# Patient Record
Sex: Male | Born: 1975 | Race: White | Hispanic: No | Marital: Married
Health system: Southern US, Community
[De-identification: ages and names within clinical notes are randomized; demographics above are authoritative.]

## PROBLEM LIST (undated history)

## (undated) DIAGNOSIS — F32A Depression, unspecified: Secondary | ICD-10-CM

## (undated) DIAGNOSIS — I10 Essential (primary) hypertension: Secondary | ICD-10-CM

## (undated) DIAGNOSIS — F419 Anxiety disorder, unspecified: Secondary | ICD-10-CM

## (undated) HISTORY — PX: DENTAL SURGERY: SHX609

## (undated) HISTORY — PX: FRACTURE SURGERY: SHX138

## (undated) HISTORY — PX: ELBOW SURGERY: SHX618

---

## 1999-02-27 ENCOUNTER — Emergency Department (HOSPITAL_COMMUNITY): Admission: EM | Admit: 1999-02-27 | Discharge: 1999-02-27 | Payer: Self-pay | Admitting: Emergency Medicine

## 1999-05-17 ENCOUNTER — Emergency Department (HOSPITAL_COMMUNITY): Admission: EM | Admit: 1999-05-17 | Discharge: 1999-05-17 | Payer: Self-pay | Admitting: Emergency Medicine

## 1999-08-04 ENCOUNTER — Emergency Department (HOSPITAL_COMMUNITY): Admission: EM | Admit: 1999-08-04 | Discharge: 1999-08-04 | Payer: Self-pay | Admitting: Emergency Medicine

## 1999-08-05 ENCOUNTER — Encounter: Payer: Self-pay | Admitting: Emergency Medicine

## 1999-08-12 ENCOUNTER — Emergency Department (HOSPITAL_COMMUNITY): Admission: EM | Admit: 1999-08-12 | Discharge: 1999-08-12 | Payer: Self-pay | Admitting: Emergency Medicine

## 2001-03-24 ENCOUNTER — Emergency Department (HOSPITAL_COMMUNITY): Admission: EM | Admit: 2001-03-24 | Discharge: 2001-03-24 | Payer: Self-pay | Admitting: Emergency Medicine

## 2001-03-24 ENCOUNTER — Encounter: Payer: Self-pay | Admitting: Emergency Medicine

## 2009-08-04 ENCOUNTER — Emergency Department (HOSPITAL_COMMUNITY): Admission: EM | Admit: 2009-08-04 | Discharge: 2009-08-04 | Payer: Self-pay | Admitting: Emergency Medicine

## 2011-05-25 IMAGING — CT CT HEAD W/O CM
1 series · 16 of 30 positions shown, 20 images · non-contrast
Comparison: None

CLINICAL DATA: Severe headache

CT HEAD WITHOUT CONTRAST
TECHNIQUE: Contiguous axial images were obtained from the base of
the skull through the vertex without contrast

[Series 2: brain · axial · 0.47mm/px · z∈[+165,+309]mm · 16 of 30 slices shown, 20 images]
[im 2/30  brain]
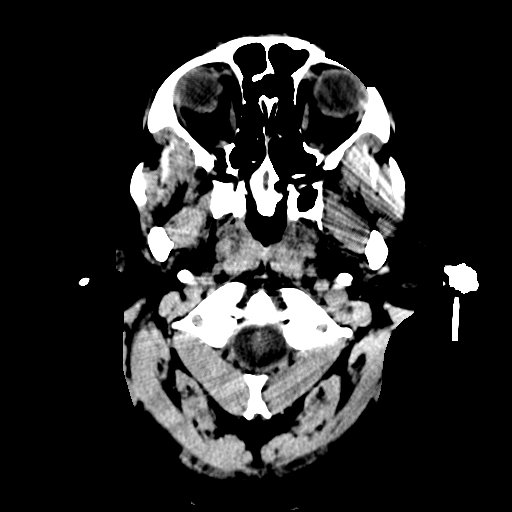
[im 2/30  bone]
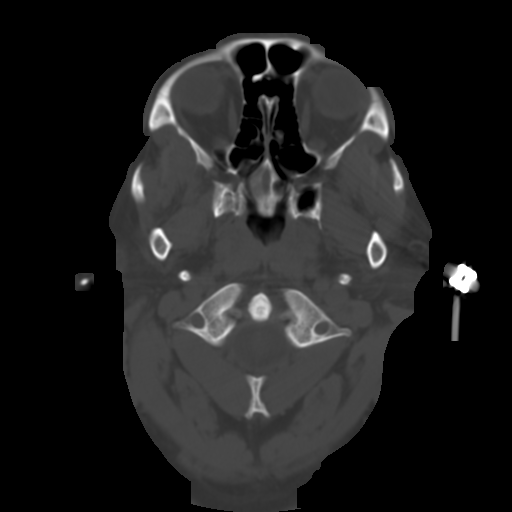
[im 4/30  brain]
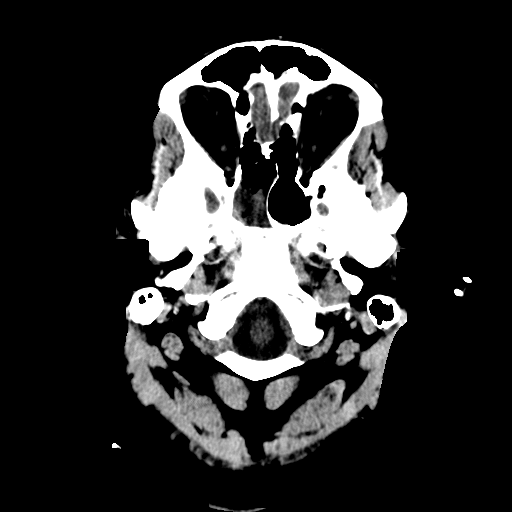
[im 6/30  brain]
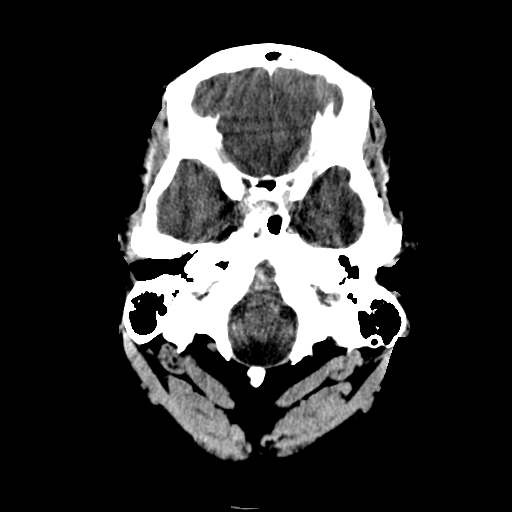
[im 8/30  brain]
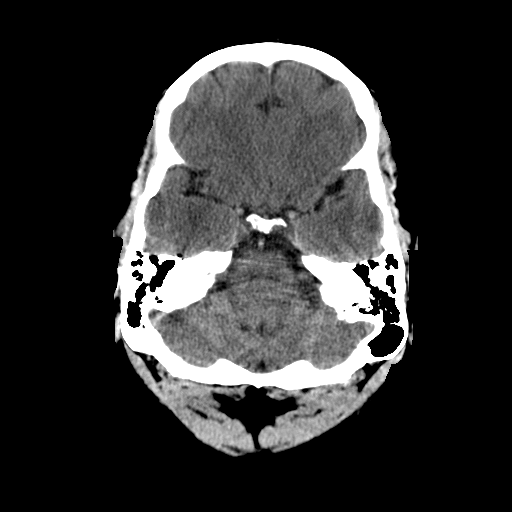
[im 9/30  brain]
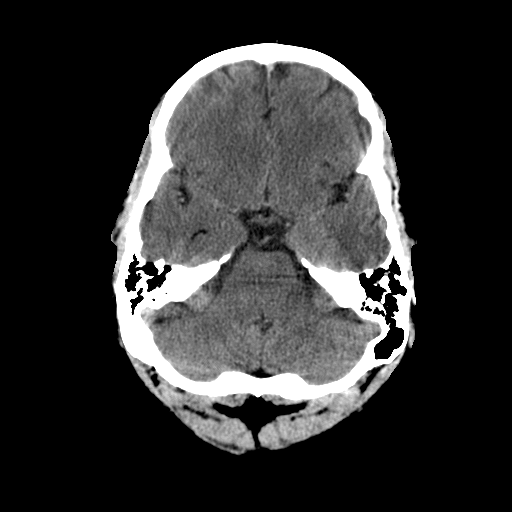
[im 9/30  bone]
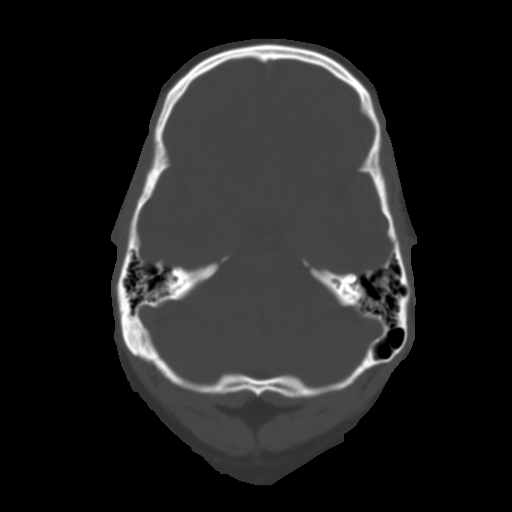
[im 11/30  brain]
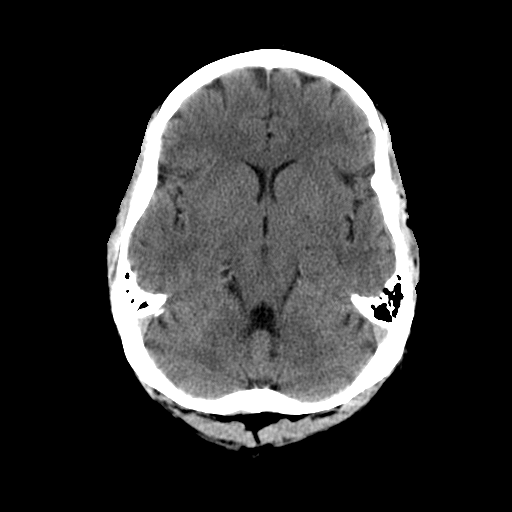
[im 13/30  brain]
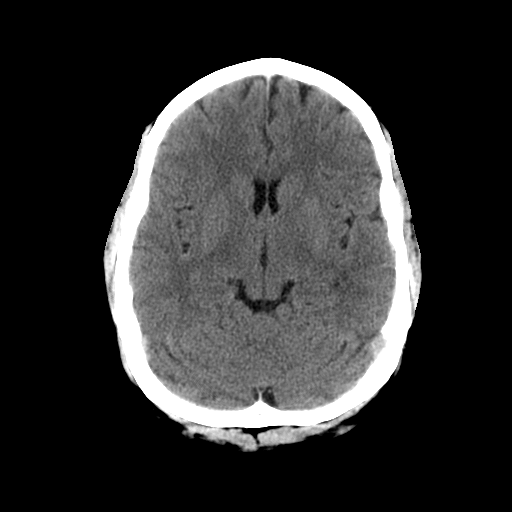
[im 15/30  brain]
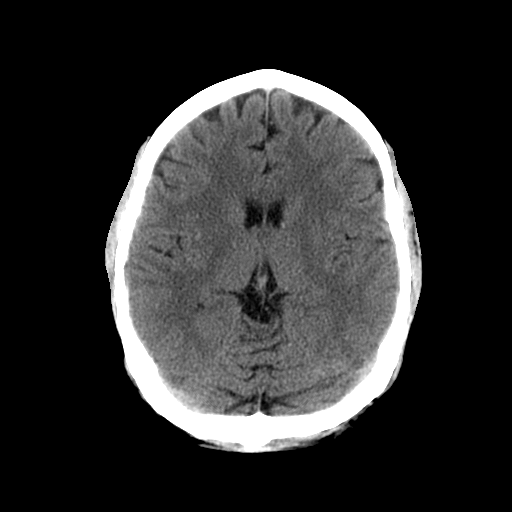
[im 16/30  brain]
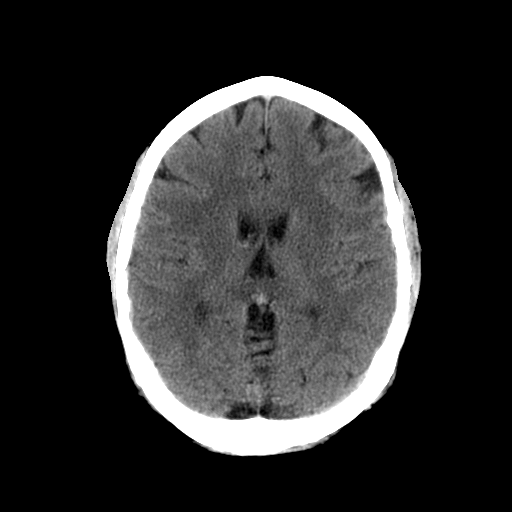
[im 16/30  bone]
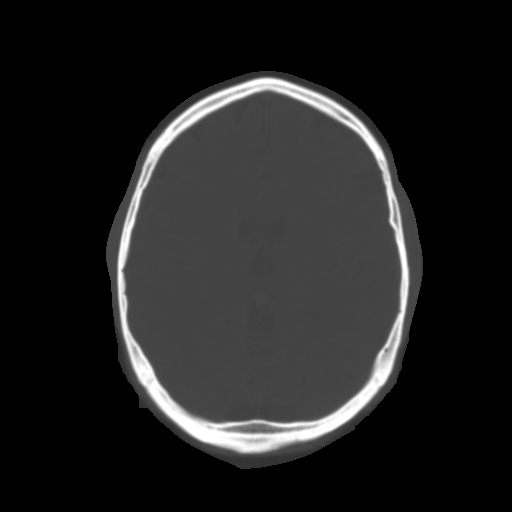
[im 18/30  brain]
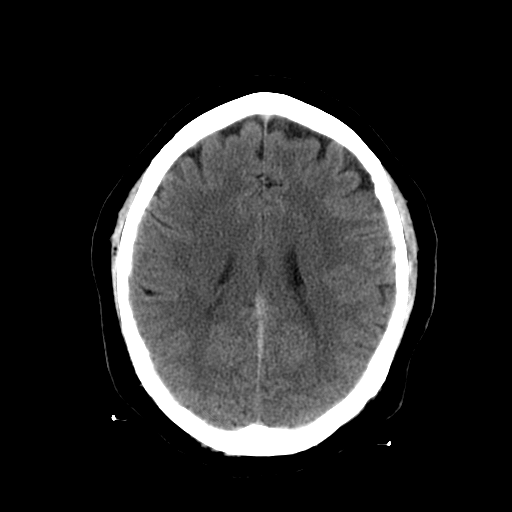
[im 20/30  brain]
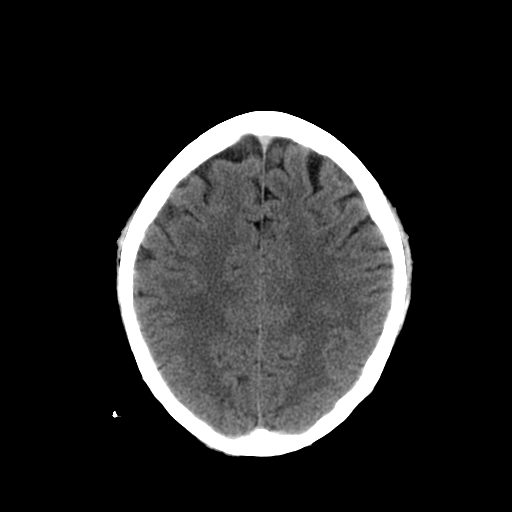
[im 22/30  brain]
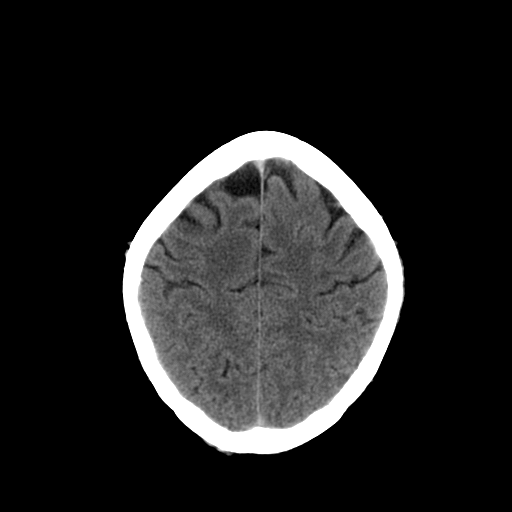
[im 23/30  brain]
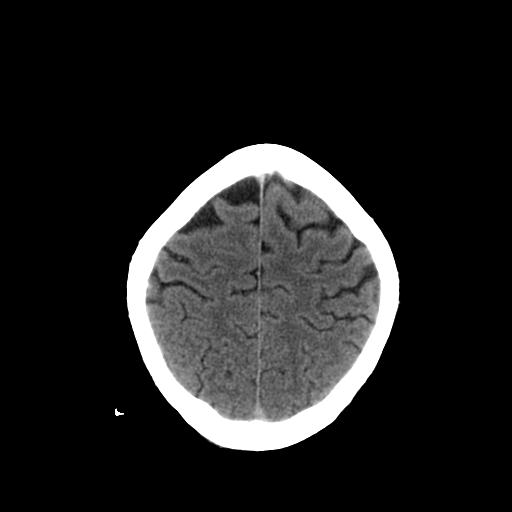
[im 23/30  bone]
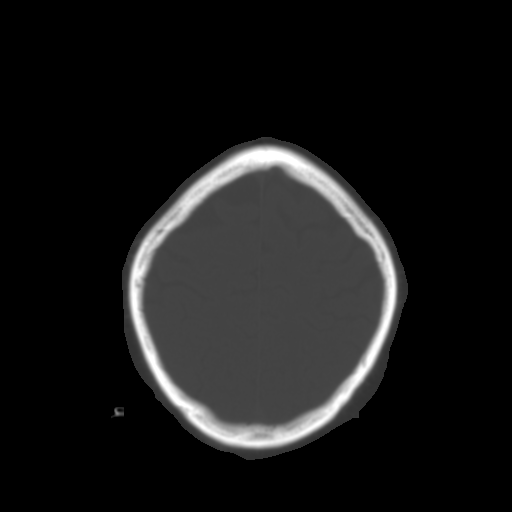
[im 25/30  brain]
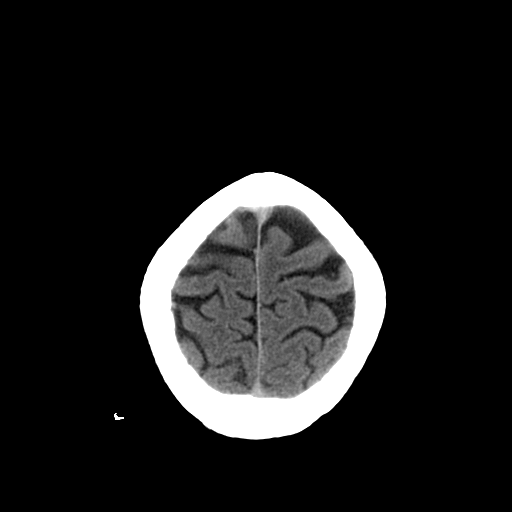
[im 27/30  brain]
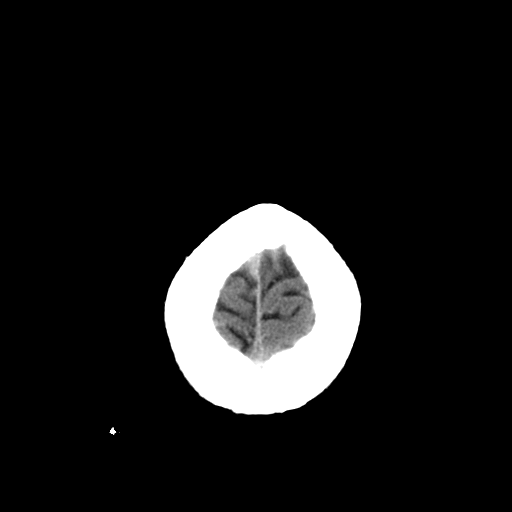
[im 29/30  brain]
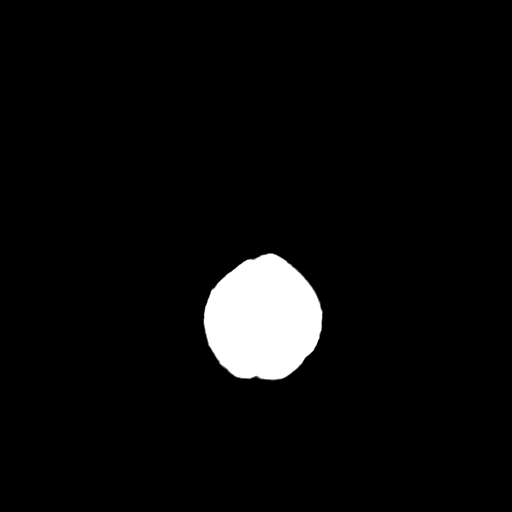

[16 of 30 positions shown; findings below may reference images not displayed]

FINDINGS: There is no evidence of intracranial hemorrhage, brain
edema, or other signs of acute infarction.  There is no evidence of
intracranial mass lesion or mass effect.  No abnormal extraaxial
fluid collections are identified.  There is no evidence of
hydrocephalus, or other significant intracranial abnormality.  No
skull abnormality identified.  Mucosal thickening is seen involving
the right sphenoid sinus, consistent with chronic sinusitis.
IMPRESSION: 1.  No intracranial abnormality identified.
2.  Chronic right sphenoid sinusitis.

## 2020-01-23 ENCOUNTER — Ambulatory Visit: Payer: Medicaid Other | Attending: Internal Medicine

## 2020-01-23 DIAGNOSIS — Z23 Encounter for immunization: Secondary | ICD-10-CM

## 2020-01-23 NOTE — Progress Notes (Signed)
   [id number]  Name:  James Bradshaw.    MRN: [id number] DOB: 02-23-1976  01/23/2020  James Bradshaw was observed post Covid-19 immunization for 15 minutes without incident. He was provided with Vaccine Information Sheet and instruction to access the V-Safe system.   James Bradshaw was instructed to call 911 with any severe reactions post vaccine: Marland Kitchen Difficulty breathing  . Swelling of face and throat  . A fast heartbeat  . A bad rash all over body  . Dizziness and weakness   Immunizations Administered    Name Date Dose VIS Date Route   Pfizer COVID-19 Vaccine 01/23/2020  1:15 PM 0.3 mL 10/17/2019 Intramuscular   Manufacturer: [employer], [employer]   Lot: [id number]   NDC: [address]-7

## 2020-02-17 ENCOUNTER — Ambulatory Visit: Payer: Medicaid Other | Attending: Internal Medicine

## 2020-02-17 DIAGNOSIS — Z23 Encounter for immunization: Secondary | ICD-10-CM

## 2020-02-17 NOTE — Progress Notes (Signed)
   [id number]  Name:  James Bradshaw.    MRN: [id number] DOB: [birth year]  02/17/2020  [name] was observed post Covid-19 immunization for 15 minutes without incident. He was provided with Vaccine Information Sheet and instruction to access the V-Safe system.   [name] was instructed to call 911 with any severe reactions post vaccine: Marland Kitchen Difficulty breathing  . Swelling of face and throat  . A fast heartbeat  . A bad rash all over body  . Dizziness and weakness   Immunizations Administered    Name Date Dose VIS Date Route   Pfizer COVID-19 Vaccine 02/17/2020  2:07 PM 0.3 mL 10/17/2019 Intramuscular   Manufacturer: [employer], [employer]   Lot: [id number]   NDC: [address]-7

## 2021-02-09 ENCOUNTER — Other Ambulatory Visit: Payer: Self-pay | Admitting: Family Medicine

## 2021-02-09 DIAGNOSIS — R1909 Other intra-abdominal and pelvic swelling, mass and lump: Secondary | ICD-10-CM

## 2021-03-02 ENCOUNTER — Ambulatory Visit: Payer: BLUE CROSS/BLUE SHIELD

## 2023-07-31 ENCOUNTER — Other Ambulatory Visit: Payer: Self-pay | Admitting: General Surgery

## 2023-07-31 DIAGNOSIS — R1909 Other intra-abdominal and pelvic swelling, mass and lump: Secondary | ICD-10-CM

## 2023-08-02 ENCOUNTER — Ambulatory Visit
Admission: RE | Admit: 2023-08-02 | Discharge: 2023-08-02 | Disposition: A | Discharge status: Home / Self Care | Payer: Medicaid Other | Source: Ambulatory Visit | Attending: General Surgery | Admitting: General Surgery

## 2023-08-02 DIAGNOSIS — R1909 Other intra-abdominal and pelvic swelling, mass and lump: Secondary | ICD-10-CM | POA: Diagnosis present

## 2023-09-13 ENCOUNTER — Ambulatory Visit: Payer: Self-pay | Admitting: General Surgery

## 2023-09-14 ENCOUNTER — Encounter
Admission: RE | Admit: 2023-09-14 | Discharge: 2023-09-14 | Disposition: A | Discharge status: Home / Self Care | Payer: Medicaid Other | Source: Ambulatory Visit | Attending: General Surgery | Admitting: General Surgery

## 2023-09-14 ENCOUNTER — Other Ambulatory Visit: Payer: Self-pay

## 2023-09-14 VITALS — Ht 75.0 in | Wt 353.0 lb

## 2023-09-14 DIAGNOSIS — I10 Essential (primary) hypertension: Secondary | ICD-10-CM

## 2023-09-14 HISTORY — DX: Anxiety disorder, unspecified: F41.9

## 2023-09-14 HISTORY — DX: Depression, unspecified: F32.A

## 2023-09-14 HISTORY — DX: Essential (primary) hypertension: I10

## 2023-09-14 NOTE — Patient Instructions (Addendum)
Your procedure is scheduled on: Friday 09/21/23  To find out your arrival time, please call [phone] between 1PM - 3PM on:  Thursday 09/20/23  Report to the Registration Desk on the 1st floor of the [facility]. Free Valet parking is available.  If your arrival time is 6:00 am, do not arrive before that time as the [facility] entrance doors do not open until 6:00 am.  REMEMBER: Instructions that are not followed completely may result in serious medical risk, up to and including death; or upon the discretion of your surgeon and anesthesiologist your surgery may need to be rescheduled.  Do not eat food or drink any liquids after midnight the night before surgery.  No gum chewing or hard candies.  One week prior to surgery: Stop Anti-inflammatories (NSAIDS) such as Advil, Aleve, Ibuprofen, Motrin, Naproxen, Naprosyn and Aspirin based products such as Excedrin, Goody's Powder, BC Powder. You may however, continue to take Tylenol if needed for pain up until the day of surgery.  Stop ANY OVER THE COUNTER supplements or vitamins until after surgery.  Continue taking all prescribed medications.   TAKE ONLY THESE MEDICATIONS THE MORNING OF SURGERY WITH A SIP OF WATER:  escitalopram (LEXAPRO) 20 MG tablet   Antacid (take one the night before and one on the morning of surgery - helps to prevent nausea after surgery.)  No Alcohol for 24 hours before or after surgery.  No Smoking including e-cigarettes for 24 hours before surgery.  No chewable tobacco products for at least 6 hours before surgery.  No nicotine patches on the day of surgery.  Do not use any "recreational" drugs for at least a week (preferably 2 weeks) before your surgery.  Please be advised that the combination of cocaine and anesthesia may have negative outcomes, up to and including death. If you test positive for cocaine, your surgery will be cancelled.  On the morning of surgery brush your teeth with toothpaste and  water, you may rinse your mouth with mouthwash if you wish. Do not swallow any toothpaste or mouthwash.  Use CHG Soap or wipes as directed on instruction sheet.  Do not wear lotions, powders, or perfumes/cologne or deodorant.   Do not shave body hair from the neck down 48 hours before surgery.  Wear comfortable clothing (specific to your surgery type) to the hospital.  Do not wear jewelry, make-up, hairpins, clips or nail polish.  For welded (permanent) jewelry: bracelets, anklets, waist bands, etc.  Please have this removed prior to surgery.  If it is not removed, there is a chance that hospital personnel will need to cut it off on the day of surgery. Contact lenses, hearing aids and dentures may not be worn into surgery.  Do not bring valuables to the hospital. [facility] is not responsible for any missing/lost belongings or valuables.   Notify your doctor if there is any change in your medical condition (cold, fever, infection).  If you are being discharged the day of surgery, you will not be allowed to drive home. You will need a responsible individual to drive you home and stay with you for 24 hours after surgery.   If you are taking public transportation, you will need to have a responsible individual with you.  If you are being admitted to the hospital overnight, leave your suitcase in the car. After surgery it may be brought to your room.  In case of increased patient census, it may be necessary for you, the patient, to  continue your postoperative care in the Same Day Surgery department.  After surgery, you can help prevent lung complications by doing breathing exercises.  Take deep breaths and cough every 1-2 hours. Your doctor may order a device called an Incentive Spirometer to help you take deep breaths. When coughing or sneezing, hold a pillow firmly against your incision with both hands. This is called "splinting." Doing this helps protect your incision. It also  decreases belly discomfort.  Surgery Visitation Policy:  Patients undergoing a surgery or procedure may have two family members or support persons with them as long as the person is not COVID-19 positive or experiencing its symptoms.   Inpatient Visitation:    Visiting hours are 7 a.m. to 8 p.m. Up to four visitors are allowed at one time in a patient room. The visitors may rotate out with other people during the day. One designated support person (adult) may remain overnight.  Please call the Pre-admissions Testing Dept. at [phone] if you have any questions about these instructions.     Preparing for Surgery with CHLORHEXIDINE GLUCONATE (CHG) Soap  Chlorhexidine Gluconate (CHG) Soap  o An antiseptic cleaner that kills germs and bonds with the skin to continue killing germs even after washing  o Used for showering the night before surgery and morning of surgery  Before surgery, you can play an important role by reducing the number of germs on your skin.  CHG (Chlorhexidine gluconate) soap is an antiseptic cleanser which kills germs and bonds with the skin to continue killing germs even after washing.  Please do not use if you have an allergy to CHG or antibacterial soaps. If your skin becomes reddened/irritated stop using the CHG.  1. Shower the NIGHT BEFORE SURGERY and the MORNING OF SURGERY with CHG soap.  2. If you choose to wash your hair, wash your hair first as usual with your normal shampoo.  3. After shampooing, rinse your hair and body thoroughly to remove the shampoo.  4. Use CHG as you would any other liquid soap. You can apply CHG directly to the skin and wash gently with a scrungie or a clean washcloth.  5. Apply the CHG soap to your body only from the neck down. Do not use on open wounds or open sores. Avoid contact with your eyes, ears, mouth, and genitals (private parts). Wash face and genitals (private parts) with your normal soap.  6. Wash thoroughly,  paying special attention to the area where your surgery will be performed.  7. Thoroughly rinse your body with warm water.  8. Do not shower/wash with your normal soap after using and rinsing off the CHG soap.  9. Pat yourself dry with a clean towel.  10. Wear clean pajamas to bed the night before surgery.  12. Place clean sheets on your bed the night of your first shower and do not sleep with pets.  13. Shower again with the CHG soap on the day of surgery prior to arriving at the hospital.  14. Do not apply any deodorants/lotions/powders.  15. Please wear clean clothes to the hospital.

## 2023-09-17 ENCOUNTER — Encounter
Admission: RE | Admit: 2023-09-17 | Discharge: 2023-09-17 | Disposition: A | Discharge status: Home / Self Care | Payer: Medicaid Other | Source: Ambulatory Visit | Attending: General Surgery | Admitting: General Surgery

## 2023-09-17 DIAGNOSIS — Z01810 Encounter for preprocedural cardiovascular examination: Secondary | ICD-10-CM | POA: Diagnosis present

## 2023-09-17 DIAGNOSIS — I10 Essential (primary) hypertension: Secondary | ICD-10-CM | POA: Insufficient documentation

## 2023-09-21 ENCOUNTER — Ambulatory Visit
Admission: RE | Admit: 2023-09-21 | Discharge: 2023-09-21 | Disposition: A | Discharge status: Home / Self Care | Payer: Medicaid Other | Attending: General Surgery | Admitting: General Surgery

## 2023-09-21 ENCOUNTER — Ambulatory Visit: Payer: Medicaid Other | Admitting: Anesthesiology

## 2023-09-21 ENCOUNTER — Other Ambulatory Visit: Payer: Self-pay

## 2023-09-21 ENCOUNTER — Encounter: Payer: Self-pay | Admitting: General Surgery

## 2023-09-21 ENCOUNTER — Encounter
Admission: RE | Disposition: A | Discharge status: Home / Self Care | Payer: Self-pay | Source: Home / Self Care | Attending: General Surgery

## 2023-09-21 DIAGNOSIS — R1909 Other intra-abdominal and pelvic swelling, mass and lump: Secondary | ICD-10-CM | POA: Diagnosis present

## 2023-09-21 DIAGNOSIS — D171 Benign lipomatous neoplasm of skin and subcutaneous tissue of trunk: Secondary | ICD-10-CM | POA: Insufficient documentation

## 2023-09-21 HISTORY — PX: LIPOMA EXCISION: SHX5283

## 2023-09-21 SURGERY — EXCISION LIPOMA
Anesthesia: General | Site: Groin | Laterality: Right | Wound class: Clean

## 2023-09-21 MED ORDER — ONDANSETRON HCL 4 MG/2ML IJ SOLN
INTRAMUSCULAR | Status: DC | PRN
  Administered 2023-09-21 (×2): 4 mg via INTRAVENOUS

## 2023-09-21 MED ORDER — MIDAZOLAM HCL 2 MG/2ML IJ SOLN
INTRAMUSCULAR | Status: DC | PRN
  Administered 2023-09-21: 2 mg via INTRAVENOUS

## 2023-09-21 MED ORDER — ROCURONIUM BROMIDE 100 MG/10ML IV SOLN
INTRAVENOUS | Status: DC | PRN
  Administered 2023-09-21: 50 mg via INTRAVENOUS

## 2023-09-21 MED ORDER — BUPIVACAINE-EPINEPHRINE (PF) 0.5% -1:200000 IJ SOLN
INTRAMUSCULAR | Status: AC
  Filled 2023-09-21: qty 30

## 2023-09-21 MED ORDER — OXYCODONE HCL 5 MG/5ML PO SOLN
5.0000 mg | Freq: Once | ORAL | Status: DC | PRN
Start: 2023-09-21 — End: 2023-09-21

## 2023-09-21 MED ORDER — LACTATED RINGERS IV SOLN: INTRAVENOUS | Status: DC

## 2023-09-21 MED ORDER — PROPOFOL 1000 MG/100ML IV EMUL
INTRAVENOUS | Status: AC
Start: 2023-09-21 — End: ?
  Filled 2023-09-21: qty 100

## 2023-09-21 MED ORDER — PROPOFOL 10 MG/ML IV BOLUS
INTRAVENOUS | Status: DC | PRN
  Administered 2023-09-21: 30 mg via INTRAVENOUS
  Administered 2023-09-21: 200 mg via INTRAVENOUS

## 2023-09-21 MED ORDER — CEFAZOLIN SODIUM-DEXTROSE 2-4 GM/100ML-% IV SOLN
2.0000 g | INTRAVENOUS | Status: AC
Start: 2023-09-21 — End: 2023-09-21
  Administered 2023-09-21: 1 g via INTRAVENOUS
  Administered 2023-09-21: 2 g via INTRAVENOUS

## 2023-09-21 MED ORDER — CHLORHEXIDINE GLUCONATE 0.12 % MT SOLN
OROMUCOSAL | Status: AC
Start: 2023-09-21 — End: ?
  Filled 2023-09-21: qty 15

## 2023-09-21 MED ORDER — LIDOCAINE HCL (CARDIAC) PF 100 MG/5ML IV SOSY
PREFILLED_SYRINGE | INTRAVENOUS | Status: DC | PRN
  Administered 2023-09-21: 100 mg via INTRAVENOUS

## 2023-09-21 MED ORDER — FENTANYL CITRATE (PF) 100 MCG/2ML IJ SOLN: 25.0000 ug | INTRAMUSCULAR | Status: DC | PRN

## 2023-09-21 MED ORDER — ACETAMINOPHEN 10 MG/ML IV SOLN
INTRAVENOUS | Status: DC | PRN
  Administered 2023-09-21: 1000 mg via INTRAVENOUS

## 2023-09-21 MED ORDER — ACETAMINOPHEN 10 MG/ML IV SOLN
INTRAVENOUS | Status: AC
Start: 2023-09-21 — End: ?
  Filled 2023-09-21: qty 100

## 2023-09-21 MED ORDER — BUPIVACAINE-EPINEPHRINE (PF) 0.25% -1:200000 IJ SOLN
INTRAMUSCULAR | Status: AC
Start: 2023-09-21 — End: ?
  Filled 2023-09-21: qty 30

## 2023-09-21 MED ORDER — SUCCINYLCHOLINE CHLORIDE 200 MG/10ML IV SOSY
PREFILLED_SYRINGE | INTRAVENOUS | Status: DC | PRN
  Administered 2023-09-21: 140 mg via INTRAVENOUS

## 2023-09-21 MED ORDER — PHENYLEPHRINE HCL-NACL 20-0.9 MG/250ML-% IV SOLN
INTRAVENOUS | Status: AC
Start: 2023-09-21 — End: ?
  Filled 2023-09-21: qty 250

## 2023-09-21 MED ORDER — CEFAZOLIN SODIUM-DEXTROSE 1-4 GM/50ML-% IV SOLN
INTRAVENOUS | Status: AC
  Filled 2023-09-21: qty 50

## 2023-09-21 MED ORDER — BUPIVACAINE-EPINEPHRINE 0.5% -1:200000 IJ SOLN
INTRAMUSCULAR | Status: DC | PRN
  Administered 2023-09-21: 30 mL

## 2023-09-21 MED ORDER — 0.9 % SODIUM CHLORIDE (POUR BTL) OPTIME
TOPICAL | Status: DC | PRN
  Administered 2023-09-21: 500 mL

## 2023-09-21 MED ORDER — ORAL CARE MOUTH RINSE: 15.0000 mL | Freq: Once | OROMUCOSAL | Status: AC

## 2023-09-21 MED ORDER — DEXAMETHASONE SODIUM PHOSPHATE 10 MG/ML IJ SOLN
INTRAMUSCULAR | Status: DC | PRN
  Administered 2023-09-21: 10 mg via INTRAVENOUS

## 2023-09-21 MED ORDER — CHLORHEXIDINE GLUCONATE 0.12 % MT SOLN
15.0000 mL | Freq: Once | OROMUCOSAL | Status: AC
Start: 2023-09-21 — End: 2023-09-21
  Administered 2023-09-21: 15 mL via OROMUCOSAL

## 2023-09-21 MED ORDER — DEXMEDETOMIDINE HCL IN NACL 200 MCG/50ML IV SOLN
INTRAVENOUS | Status: DC | PRN
  Administered 2023-09-21: 8 ug via INTRAVENOUS
  Administered 2023-09-21: 12 ug via INTRAVENOUS

## 2023-09-21 MED ORDER — CEFAZOLIN SODIUM-DEXTROSE 2-4 GM/100ML-% IV SOLN
INTRAVENOUS | Status: AC
  Filled 2023-09-21: qty 100

## 2023-09-21 MED ORDER — OXYCODONE HCL 5 MG PO TABS: 5.0000 mg | ORAL_TABLET | Freq: Once | ORAL | Status: DC | PRN

## 2023-09-21 MED ORDER — GLYCOPYRROLATE 0.2 MG/ML IJ SOLN
INTRAMUSCULAR | Status: DC | PRN
  Administered 2023-09-21: .2 mg via INTRAVENOUS

## 2023-09-21 MED ORDER — PROPOFOL 1000 MG/100ML IV EMUL
INTRAVENOUS | Status: AC
  Filled 2023-09-21: qty 100

## 2023-09-21 MED ORDER — MIDAZOLAM HCL 2 MG/2ML IJ SOLN
INTRAMUSCULAR | Status: AC
  Filled 2023-09-21: qty 2

## 2023-09-21 MED ORDER — PHENYLEPHRINE 80 MCG/ML (10ML) SYRINGE FOR IV PUSH (FOR BLOOD PRESSURE SUPPORT)
PREFILLED_SYRINGE | INTRAVENOUS | Status: DC | PRN
  Administered 2023-09-21: 80 ug via INTRAVENOUS
  Administered 2023-09-21 (×7): 160 ug via INTRAVENOUS

## 2023-09-21 MED ORDER — EPHEDRINE SULFATE-NACL 50-0.9 MG/10ML-% IV SOSY
PREFILLED_SYRINGE | INTRAVENOUS | Status: DC | PRN
  Administered 2023-09-21: 5 mg via INTRAVENOUS

## 2023-09-21 MED ORDER — FENTANYL CITRATE (PF) 100 MCG/2ML IJ SOLN
INTRAMUSCULAR | Status: AC
  Filled 2023-09-21: qty 2

## 2023-09-21 MED ORDER — FENTANYL CITRATE (PF) 100 MCG/2ML IJ SOLN
INTRAMUSCULAR | Status: DC | PRN
  Administered 2023-09-21 (×2): 50 ug via INTRAVENOUS

## 2023-09-21 MED ORDER — SUGAMMADEX SODIUM 200 MG/2ML IV SOLN
INTRAVENOUS | Status: DC | PRN
  Administered 2023-09-21: 300 mg via INTRAVENOUS

## 2023-09-21 SURGICAL SUPPLY — 28 items
CHLORAPREP W/TINT 26 (MISCELLANEOUS) ×2 IMPLANT
DERMABOND ADVANCED .7 DNX12 (GAUZE/BANDAGES/DRESSINGS) ×2 IMPLANT
DRAPE LAPAROTOMY 100X77 ABD (DRAPES) ×2 IMPLANT
ELECT CAUTERY BLADE 6.4 (BLADE) ×2 IMPLANT
ELECT REM PT RETURN 9FT ADLT (ELECTROSURGICAL) ×1
ELECTRODE REM PT RTRN 9FT ADLT (ELECTROSURGICAL) ×2 IMPLANT
GLOVE BIO SURGEON STRL SZ 6.5 (GLOVE) ×2 IMPLANT
GLOVE BIOGEL PI IND STRL 6.5 (GLOVE) ×2 IMPLANT
GOWN STRL REUS W/ TWL LRG LVL3 (GOWN DISPOSABLE) ×4 IMPLANT
GOWN STRL REUS W/TWL LRG LVL3 (GOWN DISPOSABLE) ×2
KIT TURNOVER KIT A (KITS) ×2 IMPLANT
LABEL OR SOLS (LABEL) ×2 IMPLANT
MANIFOLD NEPTUNE II (INSTRUMENTS) ×2 IMPLANT
NDL HYPO 25X1 1.5 SAFETY (NEEDLE) ×2 IMPLANT
NEEDLE HYPO 25X1 1.5 SAFETY (NEEDLE) ×1 IMPLANT
NS IRRIG 500ML POUR BTL (IV SOLUTION) ×2 IMPLANT
PACK BASIN MINOR ARMC (MISCELLANEOUS) ×2 IMPLANT
SUT ETHILON 3-0 (SUTURE) IMPLANT
SUT MNCRL 4-0 (SUTURE) ×1
SUT MNCRL 4-0 27XMFL (SUTURE) ×1
SUT VIC AB 2-0 SH 27 (SUTURE) ×1
SUT VIC AB 2-0 SH 27XBRD (SUTURE) IMPLANT
SUT VIC AB 3-0 SH 27 (SUTURE) ×1
SUT VIC AB 3-0 SH 27X BRD (SUTURE) ×2 IMPLANT
SUTURE MNCRL 4-0 27XMF (SUTURE) ×2 IMPLANT
SYR 10ML LL (SYRINGE) ×2 IMPLANT
TRAP FLUID SMOKE EVACUATOR (MISCELLANEOUS) ×2 IMPLANT
WATER STERILE IRR 500ML POUR (IV SOLUTION) ×2 IMPLANT

## 2023-09-21 NOTE — Anesthesia Postprocedure Evaluation (Signed)
Anesthesia Post Note  Patient: James Bradshaw  Procedure(s) Performed: EXCISION LIPOMA (Right: Groin)  Patient location during evaluation: PACU Anesthesia Type: General Level of consciousness: awake and alert Pain management: pain level controlled Vital Signs Assessment: post-procedure vital signs reviewed and stable Respiratory status: spontaneous breathing, nonlabored ventilation, respiratory function stable and patient connected to nasal cannula oxygen Cardiovascular status: blood pressure returned to baseline and stable Postop Assessment: no apparent nausea or vomiting Anesthetic complications: yes  Encounter Notable Events  Notable Event Outcome Phase Comment  Difficult to intubate - expected  Intraprocedure Filed from anesthesia note documentation.     Last Vitals:  Vitals:   09/21/23 0846 09/21/23 0857  BP: (!) 79/56 (!) 84/58  Pulse: 68 67  Resp: (!) 25 18  Temp:    SpO2: 92% 91%    Last Pain:  Vitals:   09/21/23 0828  TempSrc:   PainSc: Asleep                 Stephanie Coup

## 2023-09-21 NOTE — Anesthesia Preprocedure Evaluation (Signed)
Anesthesia Evaluation  Patient identified by MRN, date of birth, ID band Patient awake    Reviewed: Allergy & Precautions, NPO status , Patient's Chart, lab work & pertinent test results  Airway Mallampati: III  TM Distance: >3 FB Neck ROM: full    Dental  (+) Chipped, Dental Advidsory Given   Pulmonary neg pulmonary ROS, former smoker   Pulmonary exam normal        Cardiovascular hypertension, negative cardio ROS Normal cardiovascular exam     Neuro/Psych  PSYCHIATRIC DISORDERS Anxiety     negative neurological ROS     GI/Hepatic negative GI ROS, Neg liver ROS,,,  Endo/Other    Class 3 obesity  Renal/GU      Musculoskeletal   Abdominal   Peds  Hematology negative hematology ROS (+)   Anesthesia Other Findings Past Medical History: No date: Anxiety No date: Depression No date: Hypertension  Past Surgical History: No date: DENTAL SURGERY     Comment:  several No date: ELBOW SURGERY; Left     Comment:  pinning No date: FRACTURE SURGERY; Right     Comment:  lower extremity  BMI    Body Mass Index: 42.93 kg/m      Reproductive/Obstetrics negative OB ROS                             Anesthesia Physical Anesthesia Plan  ASA: 3  Anesthesia Plan: General   Post-op Pain Management:    Induction: Intravenous  PONV Risk Score and Plan: 2 and Ondansetron, Dexamethasone and Midazolam  Airway Management Planned: LMA  Additional Equipment:   Intra-op Plan:   Post-operative Plan: Extubation in OR  Informed Consent: I have reviewed the patients History and Physical, chart, labs and discussed the procedure including the risks, benefits and alternatives for the proposed anesthesia with the patient or authorized representative who has indicated his/her understanding and acceptance.     Dental Advisory Given  Plan Discussed with: Anesthesiologist, CRNA and Surgeon  Anesthesia  Plan Comments: (Patient consented for risks of anesthesia including but not limited to:  - adverse reactions to medications - damage to eyes, teeth, lips or other oral mucosa - nerve damage due to positioning  - sore throat or hoarseness - Damage to heart, brain, nerves, lungs, other parts of body or loss of life  Patient voiced understanding and assent.)       Anesthesia Quick Evaluation

## 2023-09-21 NOTE — Discharge Instructions (Signed)
  Diet: Resume home heart healthy regular diet.   Activity: No specific activity restrictions.   Wound care: May shower with soapy water and pat dry (do not rub incisions), but no baths or submerging incision underwater until follow-up. (no swimming)   Medications: Resume all home medications. For mild to moderate pain: acetaminophen (Tylenol) or ibuprofen (if no kidney disease). Combining Tylenol with alcohol can substantially increase your risk of causing liver disease.   Call office [phone]) at any time if any questions, worsening pain, fevers/chills, bleeding, drainage from incision site, or other concerns.

## 2023-09-21 NOTE — Anesthesia Procedure Notes (Signed)
Procedure Name: Intubation Date/Time: 09/21/2023 7:40 AM  Performed by: Mohammed Kindle, CRNAPre-anesthesia Checklist: Patient identified, Emergency Drugs available, Suction available and Patient being monitored Patient Re-evaluated:Patient Re-evaluated prior to induction Oxygen Delivery Method: Circle system utilized Preoxygenation: Pre-oxygenation with 100% oxygen Induction Type: IV induction Ventilation: Two handed mask ventilation required Laryngoscope Size: McGrath and 4 Grade View: Grade I Tube type: Oral Number of attempts: 1 Airway Equipment and Method: Stylet Placement Confirmation: ETT inserted through vocal cords under direct vision, positive ETCO2, breath sounds checked- equal and bilateral and CO2 detector Secured at: 22 cm Tube secured with: Tape Dental Injury: Teeth and Oropharynx as per pre-operative assessment  Difficulty Due To: Difficulty was anticipated, Difficult Airway- due to dentition and Difficult Airway- due to limited oral opening Future Recommendations: Recommend- induction with short-acting agent, and alternative techniques readily available

## 2023-09-21 NOTE — Op Note (Signed)
OPERATION REPORT  Pre Operative Diagnosis: Soft tissue mass of right groin  Post operative diagnosis: Same  Anesthesia: General and Local   Surgeon: Dr. Hazle Quant   Indication: This 47 y.o. year old male with a soft tissue mass that is causing discomfort and increasing in size.    Description of procedure: after orienting patient about the procedure steps and benefits and patient agreed to proceed. Time out was done identifying correct patient and location of procedure. After induction of monitored sedation, local anesthesia was infiltrated around the palpable lesion. With a blade #15, an elliptical incision was made using the skin lines. Sharp dissection was carried down to the deep subfascial plane and mass was excised. The mass measured 8 cm. Deep dermal stitches were done with vicryl 2-0 to repair the laceration and skin closed with Monocryl 4-0 in subcuticular fashion. Specimen sent to pathology.    Complications: none   EBL: 5 mL  Carolan Shiver, MD, FACS

## 2023-09-21 NOTE — H&P (Signed)
PATIENT PROFILE: James Bradshaw is a 47 y.o. male who presents to the Clinic for consultation at the request of [place], PA for evaluation of right groin soft tissue mass.  PCP: Cyndia Diver, PA  HISTORY OF PRESENT ILLNESS: James Bradshaw reports he has been feeling a mass in the right groin for few years. He endorses that it started before COVID. He was fine on having an ultrasound but then COVID came and light has been busy and he has been able to take care of the mass. He endorses that the mass is growing in size. No significant pain. No pain radiation no alleviating or aggravating factors. No pain radiation. Patient denies changes of the mass during the day. Denies any bulging that comes in and out.  PROBLEM LIST: Problem List Date Reviewed: 01/04/2021   Noted  Morbid obesity with BMI of 45.0-49.9, adult (CMS/HHS-HCC) 03/15/2022  Essential hypertension 01/04/2021  Anxiety 01/04/2021  Moderate episode of recurrent major depressive disorder (CMS/HHS-HCC) 01/04/2021   GENERAL REVIEW OF SYSTEMS:   General ROS: negative for - chills, fatigue, fever, weight gain or weight loss Allergy and Immunology ROS: negative for - hives  Hematological and Lymphatic ROS: negative for - bleeding problems or bruising, negative for palpable nodes Endocrine ROS: negative for - heat or cold intolerance, hair changes Respiratory ROS: negative for - cough, shortness of breath or wheezing Cardiovascular ROS: no chest pain or palpitations GI ROS: negative for nausea, vomiting, abdominal pain, diarrhea, constipation Musculoskeletal ROS: negative for - joint swelling or muscle pain Neurological ROS: negative for - confusion, syncope Dermatological ROS: negative for pruritus and rash Psychiatric: negative for anxiety, depression, difficulty sleeping and memory loss  MEDICATIONS: Current Outpatient Medications  Medication Sig Dispense Refill  escitalopram oxalate (LEXAPRO) 10 MG tablet Take 1 tablet (10 mg  total) by mouth once daily 90 tablet 3  hydroCHLOROthiazide (HYDRODIURIL) 25 MG tablet Take 1 tablet (25 mg total) by mouth once daily 90 tablet 3  telmisartan (MICARDIS) 40 MG tablet Take 1 tablet (40 mg total) by mouth once daily 90 tablet 3  meloxicam (MOBIC) 15 MG tablet Take 1 tablet (15 mg total) by mouth once daily (Patient not taking: Reported on 07/27/2023) 30 tablet 0   No current facility-administered medications for this visit.   ALLERGIES: Patient has no known allergies.  PAST MEDICAL HISTORY: Past Medical History:  Diagnosis Date  Anxiety  Arthritis  Depression  Eczema, unspecified  Epilepsy (CMS/HHS-HCC)  Fractures  GERD (gastroesophageal reflux disease)  GERD (gastroesophageal reflux disease)  History of headache  Hypertension   PAST SURGICAL HISTORY: Past Surgical History:  Procedure Laterality Date  FRACTURE SURGERY  plate and screw in t-fib right leg/ankle    FAMILY HISTORY: Family History  Problem Relation Name Age of Onset  Diabetes type II Mother  High blood pressure (Hypertension) Mother  Cancer Father  High blood pressure (Hypertension) Father  Hyperlipidemia (Elevated cholesterol) Father  Myocardial Infarction (Heart attack) Paternal Grandmother  Sudden cardiac death Paternal Grandmother    SOCIAL HISTORY: Social History   Socioeconomic History  Marital status: Married  Tobacco Use  Smoking status: Never  Passive exposure: Current  Smokeless tobacco: Never  Vaping Use  Vaping status: Some Days  Substance and Sexual Activity  Alcohol use: Never  Drug use: Never  Sexual activity: Yes  Partners: Female   Social Determinants of Health   Financial Resource Strain: Low Risk (06/06/2023)  Overall Financial Resource Strain (CARDIA)  Difficulty of Paying Living Expenses: Not hard at all  Food Insecurity: No Food Insecurity (06/06/2023)  Hunger Vital Sign  Worried About Running Out of Food in the Last Year: Never true  Ran Out of Food in  the Last Year: Never true  Transportation Needs: No Transportation Needs (06/06/2023)  PRAPARE - Risk analyst (Medical): No  Lack of Transportation (Non-Medical): No   PHYSICAL EXAM: Vitals:  07/27/23 0921  BP: (!) 154/92  Pulse: 78   Body mass index is 47.44 kg/m. Weight: (!) 171 kg (377 lb)   GENERAL: Alert, active, oriented x3  HEENT: Pupils equal reactive to light. Extraocular movements are intact. Sclera clear. Palpebral conjunctiva normal red color.Pharynx clear.  NECK: Supple with no palpable mass and no adenopathy.  LUNGS: Sound clear with no rales rhonchi or wheezes.  HEART: Regular rhythm S1 and S2 without murmur.  ABDOMEN: Soft and depressible, nontender with no palpable mass, no hepatomegaly. There is a 8 x 4 cm oval-shaped soft tissue mass in the right groin. Does not seem to be communicating with the inguinal canal. Rubbery.  EXTREMITIES: Well-developed well-nourished symmetrical with no dependent edema.  NEUROLOGICAL: Awake alert oriented, facial expression symmetrical, moving all extremities.  REVIEW OF DATA: I have reviewed the following data today: Appointment on 06/01/2023  Component Date Value  Glucose 06/01/2023 87  Sodium 06/01/2023 136  Potassium 06/01/2023 4.5  Chloride 06/01/2023 99  Carbon Dioxide (CO2) 06/01/2023 26.8  Urea Nitrogen (BUN) 06/01/2023 11  Creatinine 06/01/2023 0.9  Glomerular Filtration Ra* 06/01/2023 106  Calcium 06/01/2023 9.8  AST 06/01/2023 43 (H)  ALT 06/01/2023 75 (H)  Alk Phos (alkaline Phosp* 06/01/2023 88  Albumin 06/01/2023 4.3  Bilirubin, Total 06/01/2023 0.7  Protein, Total 06/01/2023 8.6 (H)  A/G Ratio 06/01/2023 1.0  Hemoglobin A1C 06/01/2023 5.2  Average Blood Glucose (C* 06/01/2023 103  Cholesterol, Total 06/01/2023 234 (H)  Triglyceride 06/01/2023 180  HDL (High Density Lipopr* 47/82/9562 49.0  LDL Calculated 06/01/2023 130 (H)  VLDL Cholesterol 06/01/2023 36  Cholesterol/HDL  Ratio 06/01/2023 4.8  WBC (White Blood Cell Co* 06/01/2023 10.2  RBC (Red Blood Cell Coun* 06/01/2023 5.61  Hemoglobin 06/01/2023 17.4  Hematocrit 06/01/2023 51.4  MCV (Mean Corpuscular Vo* 06/01/2023 91.6  MCH (Mean Corpuscular He* 06/01/2023 31.0  MCHC (Mean Corpuscular H* 06/01/2023 33.9  Platelet Count 06/01/2023 266  RDW-CV (Red Cell Distrib* 06/01/2023 12.6  MPV (Mean Platelet Volum* 06/01/2023 11.4  Neutrophils 06/01/2023 5.49  Lymphocytes 06/01/2023 2.74  Monocytes 06/01/2023 1.20  Eosinophils 06/01/2023 0.55  Basophils 06/01/2023 0.15 (H)  Neutrophil % 06/01/2023 54.0  Lymphocyte % 06/01/2023 26.9  Monocyte % 06/01/2023 11.8  Eosinophil % 06/01/2023 5.4 (H)  Basophil% 06/01/2023 1.5  Immature Granulocyte % 06/01/2023 0.4  Immature Granulocyte Cou* 06/01/2023 0.04    ASSESSMENT: James Bradshaw is a 47 y.o. male presenting for consultation for soft tissue mass of the right groin.  Patient with soft tissue mass of the right groin. Does not look like a hernia on physical exam. Patient will benefit of soft tissue ultrasound for further evaluation and confirmation that this is a soft tissue mass and not communicating with the inguinal canal. If this is the case it we discussed about excision of the mass. If there is any concern of a hernia I think the patient would benefit of losing weight before any hernia repair.  Ultrasound of the groin shows a 7 cm soft tissue mass, no sign of hernia.  Patient oriented about the procedure of excision of soft tissue mass.  Oriented about the risk of  bleeding, infection, pain, recurrence, among others.  The patient reported he understood and agreed to proceed with excision of right groin soft tissue mass  Right groin mass [R19.09]  PLAN: 1. Excision of right groin mass  Patient and his wife verbalized understanding, all questions were answered, and were agreeable with the plan outlined above.   Carolan Shiver, MD

## 2023-09-21 NOTE — Transfer of Care (Signed)
Immediate Anesthesia Transfer of Care Note  Patient: James Bradshaw  Procedure(s) Performed: EXCISION LIPOMA (Right: Groin)  Patient Location: PACU  Anesthesia Type:General  Level of Consciousness: awake, drowsy, and patient cooperative  Airway & Oxygen Therapy: Patient Spontanous Breathing and Patient connected to face mask oxygen  Post-op Assessment: Report given to RN and Post -op Vital signs reviewed and stable  Post vital signs: Reviewed and stable  Last Vitals:  Vitals Value Taken Time  BP 98/53 09/21/23 0828  Temp 36.2 C 09/21/23 0828  Pulse 71 09/21/23 0830  Resp 30 09/21/23 0830  SpO2 92 % 09/21/23 0830  Vitals shown include unfiled device data.  Last Pain:  Vitals:   09/21/23 0828  TempSrc:   PainSc: Asleep         Complications:  Encounter Notable Events  Notable Event Outcome Phase Comment  Difficult to intubate - expected  Intraprocedure Filed from anesthesia note documentation.

## 2023-09-26 LAB — SURGICAL PATHOLOGY

## 2023-12-24 ENCOUNTER — Ambulatory Visit
Admission: RE | Admit: 2023-12-24 | Discharge: 2023-12-24 | Disposition: A | Discharge status: Home / Self Care | Payer: Medicaid Other | Attending: Gastroenterology | Admitting: Gastroenterology

## 2023-12-24 ENCOUNTER — Encounter
Admission: RE | Disposition: A | Discharge status: Home / Self Care | Payer: Self-pay | Source: Home / Self Care | Attending: Gastroenterology

## 2023-12-24 ENCOUNTER — Ambulatory Visit: Payer: Medicaid Other | Admitting: Anesthesiology

## 2023-12-24 ENCOUNTER — Encounter: Payer: Self-pay | Admitting: *Deleted

## 2023-12-24 DIAGNOSIS — E66813 Obesity, class 3: Secondary | ICD-10-CM | POA: Diagnosis not present

## 2023-12-24 DIAGNOSIS — Z87891 Personal history of nicotine dependence: Secondary | ICD-10-CM | POA: Diagnosis not present

## 2023-12-24 DIAGNOSIS — I10 Essential (primary) hypertension: Secondary | ICD-10-CM | POA: Diagnosis not present

## 2023-12-24 DIAGNOSIS — D125 Benign neoplasm of sigmoid colon: Secondary | ICD-10-CM | POA: Diagnosis not present

## 2023-12-24 DIAGNOSIS — K5730 Diverticulosis of large intestine without perforation or abscess without bleeding: Secondary | ICD-10-CM | POA: Diagnosis not present

## 2023-12-24 DIAGNOSIS — Z1211 Encounter for screening for malignant neoplasm of colon: Secondary | ICD-10-CM | POA: Insufficient documentation

## 2023-12-24 DIAGNOSIS — Z79899 Other long term (current) drug therapy: Secondary | ICD-10-CM | POA: Insufficient documentation

## 2023-12-24 DIAGNOSIS — F32A Depression, unspecified: Secondary | ICD-10-CM | POA: Diagnosis not present

## 2023-12-24 DIAGNOSIS — K640 First degree hemorrhoids: Secondary | ICD-10-CM | POA: Diagnosis not present

## 2023-12-24 DIAGNOSIS — Z6841 Body Mass Index (BMI) 40.0 and over, adult: Secondary | ICD-10-CM | POA: Insufficient documentation

## 2023-12-24 DIAGNOSIS — D123 Benign neoplasm of transverse colon: Secondary | ICD-10-CM | POA: Insufficient documentation

## 2023-12-24 DIAGNOSIS — D122 Benign neoplasm of ascending colon: Secondary | ICD-10-CM | POA: Insufficient documentation

## 2023-12-24 DIAGNOSIS — F419 Anxiety disorder, unspecified: Secondary | ICD-10-CM | POA: Diagnosis not present

## 2023-12-24 DIAGNOSIS — D1779 Benign lipomatous neoplasm of other sites: Secondary | ICD-10-CM | POA: Insufficient documentation

## 2023-12-24 HISTORY — PX: HEMOSTASIS CLIP PLACEMENT: SHX6857

## 2023-12-24 HISTORY — PX: POLYPECTOMY: SHX5525

## 2023-12-24 HISTORY — PX: COLONOSCOPY WITH PROPOFOL: SHX5780

## 2023-12-24 HISTORY — PX: SUBMUCOSAL INJECTION: SHX5543

## 2023-12-24 SURGERY — COLONOSCOPY WITH PROPOFOL
Anesthesia: General

## 2023-12-24 MED ORDER — PHENYLEPHRINE 80 MCG/ML (10ML) SYRINGE FOR IV PUSH (FOR BLOOD PRESSURE SUPPORT)
PREFILLED_SYRINGE | INTRAVENOUS | Status: DC | PRN
  Administered 2023-12-24 (×2): 80 ug via INTRAVENOUS

## 2023-12-24 MED ORDER — EPINEPHRINE 1 MG/10ML IJ SOSY
PREFILLED_SYRINGE | INTRAMUSCULAR | Status: DC | PRN
  Administered 2023-12-24: .1 mg via INTRAVENOUS

## 2023-12-24 MED ORDER — PROPOFOL 500 MG/50ML IV EMUL
INTRAVENOUS | Status: DC | PRN
  Administered 2023-12-24: 150 ug/kg/min via INTRAVENOUS

## 2023-12-24 MED ORDER — SODIUM CHLORIDE 0.9 % IV SOLN: INTRAVENOUS | Status: DC

## 2023-12-24 MED ORDER — PHENYLEPHRINE 80 MCG/ML (10ML) SYRINGE FOR IV PUSH (FOR BLOOD PRESSURE SUPPORT)
PREFILLED_SYRINGE | INTRAVENOUS | Status: AC
  Filled 2023-12-24: qty 10

## 2023-12-24 MED ORDER — PROPOFOL 10 MG/ML IV BOLUS
INTRAVENOUS | Status: DC | PRN
  Administered 2023-12-24: 100 mg via INTRAVENOUS

## 2023-12-24 MED ORDER — PROPOFOL 1000 MG/100ML IV EMUL
INTRAVENOUS | Status: AC
  Filled 2023-12-24: qty 100

## 2023-12-24 NOTE — Op Note (Signed)
 [facility] Patient Name: James Bradshaw Procedure Date: 12/24/2023 9:03 AM MRN: [id number] Account #: [id number] Date of Birth: 04/16/1976 Admit Type: Outpatient Age: 48 Room: [facility] ENDO ROOM 3 Gender: Male Note Status: Finalized Instrument Name: Prentice Docker [id number] Procedure:             Colonoscopy Indications:           Screening for colorectal malignant neoplasm Providers:             Eather Colas MD, MD Medicines:             Monitored Anesthesia Care Complications:         No immediate complications. Procedure:             Pre-Anesthesia Assessment:                        - Prior to the procedure, a History and Physical was                         performed, and patient medications and allergies were                         reviewed. The patient is competent. The risks and                         benefits of the procedure and the sedation options and                         risks were discussed with the patient. All questions                         were answered and informed consent was obtained.                         Patient identification and proposed procedure were                         verified by the physician, the nurse, the                         anesthesiologist, the anesthetist and the technician                         in the endoscopy suite. Mental Status Examination:                         alert and oriented. Airway Examination: normal                         oropharyngeal airway and neck mobility. Respiratory                         Examination: clear to auscultation. CV Examination:                         normal. Prophylactic Antibiotics: The patient does not                         require prophylactic antibiotics. Prior  Anticoagulants: The patient has taken no anticoagulant                         or antiplatelet agents. ASA Grade Assessment: III - A                         patient with  severe systemic disease. After reviewing                         the risks and benefits, the patient was deemed in                         satisfactory condition to undergo the procedure. The                         anesthesia plan was to use monitored anesthesia care                         (MAC). Immediately prior to administration of                         medications, the patient was re-assessed for adequacy                         to receive sedatives. The heart rate, respiratory                         rate, oxygen saturations, blood pressure, adequacy of                         pulmonary ventilation, and response to care were                         monitored throughout the procedure. The physical                         status of the patient was re-assessed after the                         procedure.                        After obtaining informed consent, the colonoscope was                         passed under direct vision. Throughout the procedure,                         the patient's blood pressure, pulse, and oxygen                         saturations were monitored continuously. The                         Colonoscope was introduced through the anus and                         advanced to the the cecum, identified by appendiceal  orifice and ileocecal valve. The colonoscopy was                         performed without difficulty. The patient tolerated                         the procedure well. The quality of the bowel                         preparation was good. The ileocecal valve, appendiceal                         orifice, and rectum were photographed. Findings:      The perianal and digital rectal examinations were normal.      There was a medium-sized lipoma, 15 mm in diameter, in the ascending       colon.      Two sessile polyps were found in the ascending colon. The polyps were 4       to 5 mm in size. These polyps were removed with a  cold snare. Resection       and retrieval were complete. Estimated blood loss was minimal.      A 10 mm polyp was found in the transverse colon. The polyp was       semi-pedunculated. The polyp was removed with a hot snare. Resection and       retrieval were complete. Estimated blood loss was minimal. To prevent       bleeding post-intervention, one hemostatic clip was successfully placed.       There was no bleeding during, or at the end, of the procedure.      A 20 mm polyp was found in the sigmoid colon. The polyp was       pedunculated. Area was successfully injected with 1 mL of a 0.1 mg/mL       solution of epinephrine for drug delivery. The polyp was removed with a       hot snare. Resection and retrieval were complete. To prevent bleeding       after the polypectomy, two hemostatic clips were successfully placed.       There was no bleeding during, or at the end, of the procedure.      A few small-mouthed diverticula were found in the sigmoid colon.      Internal hemorrhoids were found during retroflexion. The hemorrhoids       were Grade I (internal hemorrhoids that do not prolapse).      The exam was otherwise without abnormality on direct and retroflexion       views. 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                        - Two 4 to 5 mm polyps in the ascending colon, removed                         with a cold snare. Resected and retrieved.                        - One 10 mm polyp in the transverse colon, removed  with a hot snare. Resected and retrieved. Clip was                         placed.                        - One 20 mm polyp in the sigmoid colon, removed with a                         hot snare. Resected and retrieved. Injected. Clips                         were placed.                        - Diverticulosis in the sigmoid colon.                        - Internal hemorrhoids.                        - The  examination was otherwise normal on direct and                         retroflexion views. Recommendation:        - Discharge patient to home.                        - Resume previous diet.                        - Continue present medications.                        - Await pathology results.                        - Repeat colonoscopy in 3 years for surveillance.                        - Return to referring physician as previously                         scheduled. Procedure Code(s):     --- Professional ---                        [phone], Colonoscopy, flexible; with removal of                         tumor(s), polyp(s), or other lesion(s) by snare                         technique                        [id number], Colonoscopy, flexible; with directed submucosal                         injection(s), any substance Diagnosis Code(s):     --- Professional ---  Z12.11, Encounter for screening for malignant neoplasm                         of colon                        D17.5, Benign lipomatous neoplasm of intra-abdominal                         organs                        D12.2, Benign neoplasm of ascending colon                        D12.3, Benign neoplasm of transverse colon (hepatic                         flexure or splenic flexure)                        D12.5, Benign neoplasm of sigmoid colon                        K64.0, First degree hemorrhoids                        K57.30, Diverticulosis of large intestine without                         perforation or abscess without bleeding CPT copyright 2022 [employer]. All rights reserved. The codes documented in this report are preliminary and upon coder review may  be revised to meet current compliance requirements. Eather Colas MD, MD 12/24/2023 9:47:07 AM Number of Addenda: 0 Note Initiated On: 12/24/2023 9:03 AM Scope Withdrawal Time: 0 hours 15 minutes 36 seconds  Total Procedure Duration:  0 hours 28 minutes 4 seconds  Estimated Blood Loss:  Estimated blood loss: none.      [facility]

## 2023-12-24 NOTE — Transfer of Care (Signed)
 Immediate Anesthesia Transfer of Care Note  Patient: James Bradshaw  Procedure(s) Performed: COLONOSCOPY WITH PROPOFOL  Patient Location: PACU  Anesthesia Type:General  Level of Consciousness: awake and sedated  Airway & Oxygen Therapy: Patient Spontanous Breathing and Patient connected to face mask oxygen  Post-op Assessment: Report given to RN and Post -op Vital signs reviewed and stable  Post vital signs: Reviewed and stable  Last Vitals:  Vitals Value Taken Time  BP    Temp    Pulse    Resp    SpO2      Last Pain:  Vitals:   12/24/23 0843  TempSrc: Temporal         Complications: There were no known notable events for this encounter.

## 2023-12-24 NOTE — Anesthesia Postprocedure Evaluation (Signed)
 Anesthesia Post Note  Patient: James Bradshaw  Procedure(s) Performed: COLONOSCOPY WITH PROPOFOL  Patient location during evaluation: Endoscopy Anesthesia Type: General Level of consciousness: awake and alert Pain management: pain level controlled Vital Signs Assessment: post-procedure vital signs reviewed and stable Respiratory status: spontaneous breathing, nonlabored ventilation, respiratory function stable and patient connected to nasal cannula oxygen Cardiovascular status: blood pressure returned to baseline and stable Postop Assessment: no apparent nausea or vomiting Anesthetic complications: no   There were no known notable events for this encounter.   Last Vitals:  Vitals:   12/24/23 0940 12/24/23 1000  BP:  104/70  Pulse:    Resp:    Temp: (!) 35.6 C   SpO2:      Last Pain:  Vitals:   12/24/23 1000  TempSrc:   PainSc: 0-No pain                 Louie Boston

## 2023-12-24 NOTE — H&P (Signed)
 Outpatient short stay form Pre-procedure 12/24/2023  Regis Bill, MD  Primary Physician: Cyndia Diver, PA-C  Reason for visit:  Screening  History of present illness:    48 y/o gentleman with history of morbid obesity and hypertension here for index screening colonoscopy. No blood thinners. No family history of GI malignancies. No significant abdominal surgeries.    Current Facility-Administered Medications:    0.9 %  sodium chloride infusion, , Intravenous, Continuous, Nusaybah Ivie, Rossie Muskrat, MD, Last Rate: 20 mL/hr at 12/24/23 0854, New Bag at 12/24/23 0854  Medications Prior to Admission  Medication Sig Dispense Refill Last Dose/Taking   Berberine Chloride (BERBERINE HCI) 500 MG CAPS Take 500-1,000 mg by mouth daily.   12/23/2023   escitalopram (LEXAPRO) 20 MG tablet Take 20 mg by mouth daily.   12/23/2023   hydrochlorothiazide (HYDRODIURIL) 25 MG tablet Take 25 mg by mouth daily.   12/23/2023   telmisartan (MICARDIS) 40 MG tablet Take 40 mg by mouth daily.   12/23/2023     No Known Allergies   Past Medical History:  Diagnosis Date   Anxiety    Depression    Hypertension     Review of systems:  Otherwise negative.    Physical Exam  Gen: Alert, oriented. Appears stated age.  HEENT: PERRLA. Lungs: No respiratory distress CV: RRR Abd: soft, benign, no masses Ext: No edema    Planned procedures: Proceed with colonoscopy. The patient understands the nature of the planned procedure, indications, risks, alternatives and potential complications including but not limited to bleeding, infection, perforation, damage to internal organs and possible oversedation/side effects from anesthesia. The patient agrees and gives consent to proceed.  Please refer to procedure notes for findings, recommendations and patient disposition/instructions.     Regis Bill, MD [facility]enter[facility]

## 2023-12-24 NOTE — Anesthesia Preprocedure Evaluation (Signed)
 Anesthesia Evaluation  Patient identified by MRN, date of birth, ID band Patient awake    Reviewed: Allergy & Precautions, NPO status , Patient's Chart, lab work & pertinent test results  History of Anesthesia Complications Negative for: history of anesthetic complications  Airway Mallampati: I  TM Distance: >3 FB Neck ROM: full    Dental no notable dental hx.    Pulmonary neg pulmonary ROS, Patient abstained from smoking., former smoker   Pulmonary exam normal        Cardiovascular hypertension, On Medications negative cardio ROS Normal cardiovascular exam     Neuro/Psych  PSYCHIATRIC DISORDERS Anxiety Depression    negative neurological ROS     GI/Hepatic negative GI ROS, Neg liver ROS,,,  Endo/Other    Class 3 obesity  Renal/GU negative Renal ROS  negative genitourinary   Musculoskeletal   Abdominal   Peds  Hematology negative hematology ROS (+)   Anesthesia Other Findings Past Medical History: No date: Anxiety No date: Depression No date: Hypertension  Past Surgical History: No date: DENTAL SURGERY     Comment:  several No date: ELBOW SURGERY; Left     Comment:  pinning No date: FRACTURE SURGERY; Right     Comment:  lower extremity 09/21/2023: LIPOMA EXCISION; Right     Comment:  Procedure: EXCISION LIPOMA;  Surgeon: Carolan Shiver, MD;  Location: [facility] ORS;  Service: General;                Laterality: Right;  BMI    Body Mass Index: 42.21 kg/m      Reproductive/Obstetrics negative OB ROS                             Anesthesia Physical Anesthesia Plan  ASA: 3  Anesthesia Plan: General   Post-op Pain Management: Minimal or no pain anticipated   Induction: Intravenous  PONV Risk Score and Plan: 1 and Propofol infusion and TIVA  Airway Management Planned: Natural Airway and Nasal Cannula  Additional Equipment:   Intra-op Plan:    Post-operative Plan:   Informed Consent: I have reviewed the patients History and Physical, chart, labs and discussed the procedure including the risks, benefits and alternatives for the proposed anesthesia with the patient or authorized representative who has indicated his/her understanding and acceptance.     Dental Advisory Given  Plan Discussed with: Anesthesiologist, CRNA and Surgeon  Anesthesia Plan Comments: (Patient consented for risks of anesthesia including but not limited to:  - adverse reactions to medications - risk of airway placement if required - damage to eyes, teeth, lips or other oral mucosa - nerve damage due to positioning  - sore throat or hoarseness - Damage to heart, brain, nerves, lungs, other parts of body or loss of life  Patient voiced understanding and assent.)       Anesthesia Quick Evaluation

## 2023-12-24 NOTE — Interval H&P Note (Signed)
 History and Physical Interval Note:  12/24/2023 8:58 AM  James Bradshaw  has presented today for surgery, with the diagnosis of Z12.11 (ICD-10-CM) - Colon cancer screening.  The various methods of treatment have been discussed with the patient and family. After consideration of risks, benefits and other options for treatment, the patient has consented to  Procedure(s): COLONOSCOPY WITH PROPOFOL (N/A) as a surgical intervention.  The patient's history has been reviewed, patient examined, no change in status, stable for surgery.  I have reviewed the patient's chart and labs.  Questions were answered to the patient's satisfaction.     Regis Bill  Ok to proceed with colonoscopy

## 2023-12-25 ENCOUNTER — Encounter: Payer: Self-pay | Admitting: Gastroenterology

## 2023-12-25 LAB — SURGICAL PATHOLOGY

## 2024-05-23 ENCOUNTER — Encounter: Payer: Self-pay | Admitting: Advanced Practice Midwife
